# Patient Record
Sex: Male | Born: 1985 | Race: White | Hispanic: No | Marital: Single | Smoking: Never smoker
Health system: Southern US, Community
[De-identification: ages and names within clinical notes are randomized; demographics above are authoritative.]

---

## 2009-03-14 ENCOUNTER — Emergency Department (HOSPITAL_COMMUNITY): Admission: EM | Admit: 2009-03-14 | Discharge: 2009-03-15 | Payer: Self-pay | Admitting: Emergency Medicine

## 2010-10-21 IMAGING — CR DG ANKLE COMPLETE 3+V*R*
3 series · 3 of 3 positions shown · non-contrast
Comparison: None

CLINICAL DATA: Fall.  Ankle injury.  Pain and swelling.

RIGHT ANKLE - COMPLETE 3+ VIEW

[t ankle joint ap right]
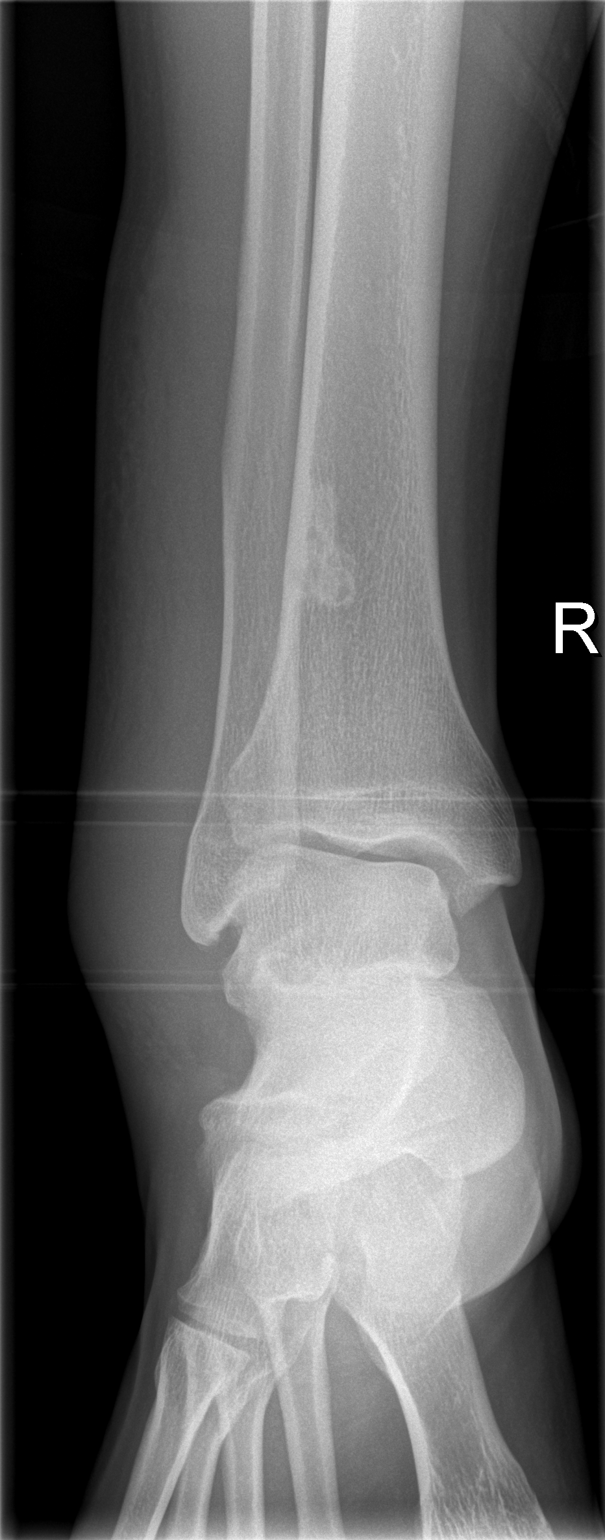

[t ankle joint oblique right]
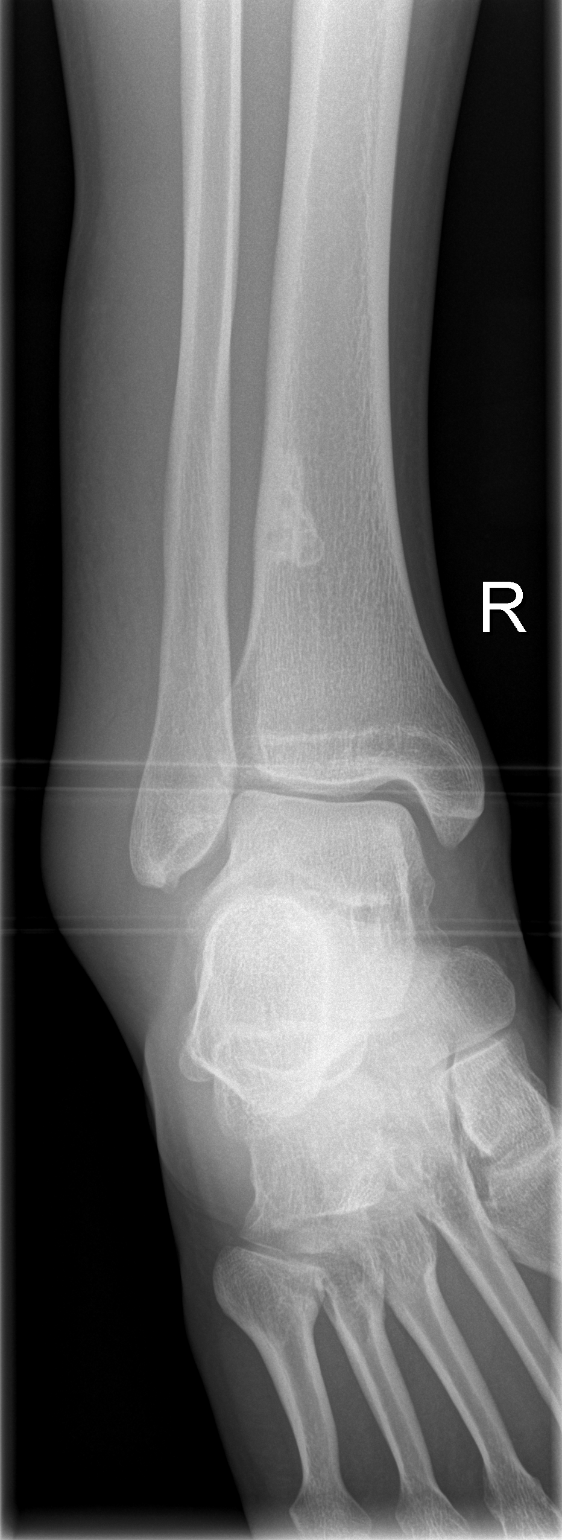

[t ankle joint lat right]
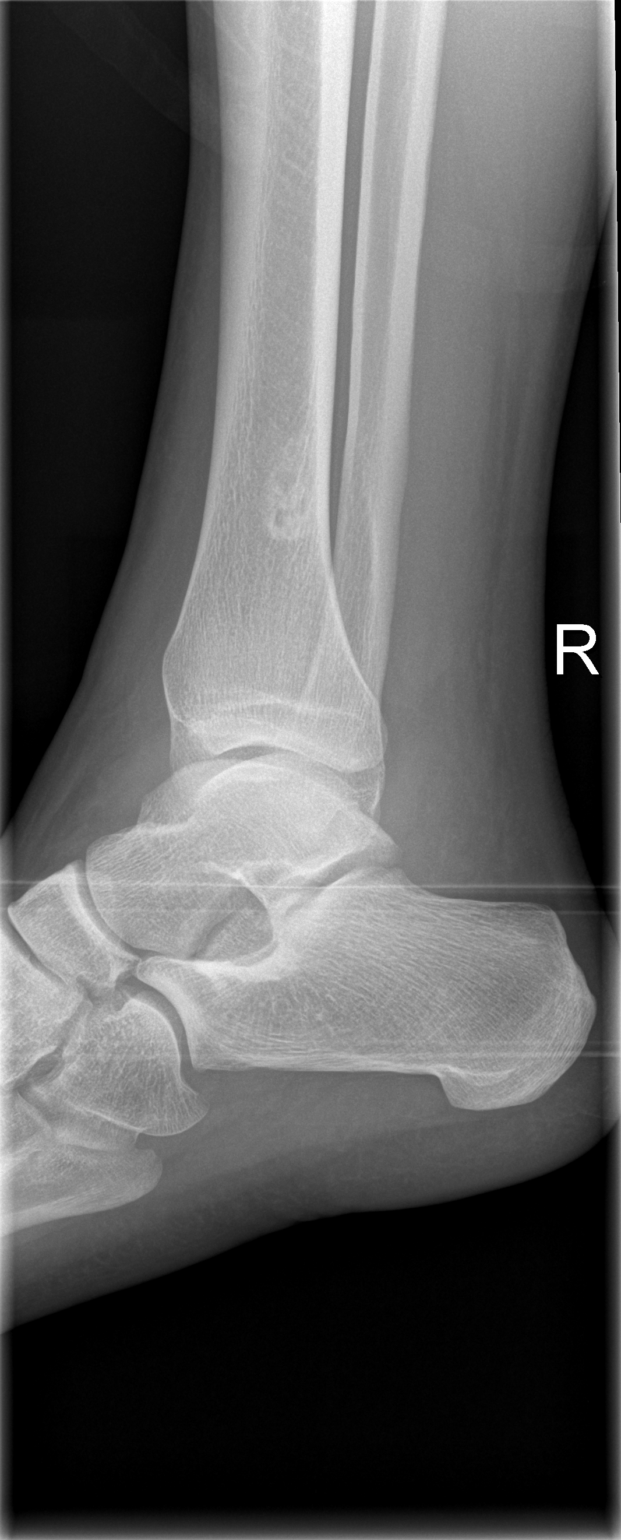

[3 of 3 positions shown; findings below may reference images not displayed]

FINDINGS: Severe soft tissue swelling is seen along the lateral
aspect of the ankle and distal fibula.  There is no evidence of
fracture or dislocation.  Incidental note is made of a non-
ossifying fibroma in the distal tibial metadiaphysis.
IMPRESSION: Lateral soft tissue swelling.  No evidence of fracture or
dislocation.

## 2016-08-15 ENCOUNTER — Encounter (HOSPITAL_COMMUNITY): Payer: Self-pay | Admitting: *Deleted

## 2016-08-15 ENCOUNTER — Emergency Department (HOSPITAL_COMMUNITY): Payer: No Typology Code available for payment source

## 2016-08-15 ENCOUNTER — Emergency Department (HOSPITAL_COMMUNITY)
Admission: EM | Admit: 2016-08-15 | Discharge: 2016-08-15 | Disposition: A | Discharge status: Home / Self Care | Payer: No Typology Code available for payment source | Attending: Emergency Medicine | Admitting: Emergency Medicine

## 2016-08-15 DIAGNOSIS — W208XXA Other cause of strike by thrown, projected or falling object, initial encounter: Secondary | ICD-10-CM | POA: Insufficient documentation

## 2016-08-15 DIAGNOSIS — Z79899 Other long term (current) drug therapy: Secondary | ICD-10-CM | POA: Insufficient documentation

## 2016-08-15 DIAGNOSIS — S99921A Unspecified injury of right foot, initial encounter: Secondary | ICD-10-CM | POA: Diagnosis present

## 2016-08-15 DIAGNOSIS — M79671 Pain in right foot: Secondary | ICD-10-CM

## 2016-08-15 DIAGNOSIS — Y93H2 Activity, gardening and landscaping: Secondary | ICD-10-CM | POA: Insufficient documentation

## 2016-08-15 DIAGNOSIS — T148XXA Other injury of unspecified body region, initial encounter: Secondary | ICD-10-CM

## 2016-08-15 DIAGNOSIS — Y999 Unspecified external cause status: Secondary | ICD-10-CM | POA: Diagnosis not present

## 2016-08-15 DIAGNOSIS — Y929 Unspecified place or not applicable: Secondary | ICD-10-CM | POA: Insufficient documentation

## 2016-08-15 DIAGNOSIS — S9031XA Contusion of right foot, initial encounter: Secondary | ICD-10-CM | POA: Insufficient documentation

## 2016-08-15 MED ORDER — OXYCODONE-ACETAMINOPHEN 5-325 MG PO TABS
1.0000 | ORAL_TABLET | ORAL | Status: DC | PRN
  Administered 2016-08-15: 1 via ORAL

## 2016-08-15 MED ORDER — HYDROCODONE-ACETAMINOPHEN 5-325 MG PO TABS: 1.0000 | ORAL_TABLET | ORAL | 0 refills | Status: AC | PRN

## 2016-08-15 MED ORDER — OXYCODONE-ACETAMINOPHEN 5-325 MG PO TABS
ORAL_TABLET | ORAL | Status: AC
  Filled 2016-08-15: qty 1

## 2016-08-15 NOTE — ED Provider Notes (Signed)
[facility]-EMERGENCY DEPT Provider Note   CSN: [id number] Arrival date & time: 08/15/16  1112  By signing my name below, I, Rosana Fret, attest that this documentation has been prepared under the direction and in the presence of non-physician practitioner, Maxwell Caul., PA-C. Electronically Signed: Rosana Fret, ED Scribe. 08/15/16. 12:53 PM.  History   Chief Complaint Chief Complaint  Patient presents with  . Foot Pain   The history is provided by the patient. No language interpreter was used.   HPI Comments: Ryan Lewis is a 31 y.o. male who presents to the Emergency Department complaining of sudden onset, moderate right foot pain onset 3 hours ago. Pt states he was cutting branches and one landed on his foot. Pt was walking fine inially due to the shock but states it is difficult now. Pt describes pain as throbbing. Pt reports associated swelling to the area and an abrasion. Pt has tried icing the area. Pt denies numbness or any other complaints at this time.  History reviewed. No pertinent past medical history.  There are no active problems to display for this patient.   History reviewed. No pertinent surgical history.   Home Medications    Prior to Admission medications   Medication Sig Start Date End Date Taking? Authorizing Provider  HYDROcodone-acetaminophen (NORCO/VICODIN) 5-325 MG tablet Take 1-2 tablets by mouth every 4 (four) hours as needed. 08/15/16   Maxwell Caul, PA-C    Family History History reviewed. No pertinent family history.  Social History Social History  Substance Use Topics  . Smoking status: Never Smoker  . Smokeless tobacco: Not on file  . Alcohol use Yes     Comment: occ     Allergies   Patient has no known allergies.   Review of Systems Review of Systems  Musculoskeletal: Positive for joint swelling and myalgias.  Skin: Positive for wound.  Neurological: Negative for numbness.     Physical Exam Updated Vital  Signs BP (!) 148/86 (BP Location: Left Arm)   Pulse (!) 104   Temp 98.6 F (37 C) (Oral)   Resp 18   SpO2 99%   Physical Exam  Constitutional: He appears well-developed and well-nourished.  Sitting comfortably on examination table  HENT:  Head: Normocephalic and atraumatic.  Eyes: Conjunctivae and EOM are normal. Right eye exhibits no discharge. Left eye exhibits no discharge. No scleral icterus.  Cardiovascular: Intact distal pulses.   2+ DP pulses bilaterally.  Pulmonary/Chest: Effort normal.  Musculoskeletal: Normal range of motion. He exhibits edema and tenderness. He exhibits no deformity.  Tenderness to palpation to the dorsal aspect of the right foot with overlying soft tissue swelling and ecchymosis. Full ROM of right ankle and right toes. No abnormalities of the left foot.   Neurological: He is alert. GCS eye subscore is 4. GCS verbal subscore is 5. GCS motor subscore is 6.  Sensation intact throughout all major nerve distributions of the foot  Skin: Skin is warm and dry.  Small superficial abrasions over the dorsal aspect of the foot  Psychiatric: He has a normal mood and affect. His speech is normal and behavior is normal.  Nursing note and vitals reviewed.    ED Treatments / Results  DIAGNOSTIC STUDIES: Oxygen Saturation is 98% on RA, normal by my interpretation.   COORDINATION OF CARE: 12:28 PM-Discussed next steps with pt including cleaning the area and follow up with ortho. Pt verbalized understanding and is agreeable with the plan.   Labs (all labs ordered are  listed, but only abnormal results are displayed) Labs Reviewed - No data to display  EKG  EKG Interpretation None       Radiology Dg Foot Complete Right  Result Date: 08/15/2016 CLINICAL DATA:  Tree limb fell on right foot today. Dorsal right foot pain and swelling. Initial encounter. EXAM: RIGHT FOOT COMPLETE - 3+ VIEW COMPARISON:  None. FINDINGS: There is no evidence of fracture or  dislocation. There is no evidence of arthropathy or other focal bone abnormality. Soft tissue swelling is seen along the dorsal aspect of the mid metatarsals. IMPRESSION: Dorsal midfoot soft tissue swelling.  No evidence of fracture. Electronically Signed   By: Myles RosenthalJohn  Stahl M.D.   On: 08/15/2016 11:50    Procedures Procedures (including critical care time)  Medications Ordered in ED Medications  oxyCODONE-acetaminophen (PERCOCET/ROXICET) 5-325 MG per tablet 1 tablet (1 tablet Oral Given 08/15/16 1124)     Initial Impression / Assessment and Plan / ED Course  I have reviewed the triage vital signs and the nursing notes.  Pertinent labs & imaging results that were available during my care of the patient were reviewed by me and considered in my medical decision making (see chart for details).      31 year old male who presents with right thumb pain after a branch fell on it this morning. Patient is neurovascularly intact. Consider fracture versus dislocation versus sprain. X-rays ordered at triage. Patient provided analgesics at triage. Patient X-Ray negative for obvious fracture or dislocation.   Discussed results with patient.Pt advised to follow up with orthopedics. Patient given a post-op shoe while in ED, conservative therapy recommended and discussed. Patient will be discharged home & is agreeable with above plan. Returns precautions discussed. Pt appears safe for discharge.   Final Clinical Impressions(s) / ED Diagnoses   Final diagnoses:  Foot pain, right  Hematoma  Contusion of right foot, initial encounter    New Prescriptions New Prescriptions   HYDROCODONE-ACETAMINOPHEN (NORCO/VICODIN) 5-325 MG TABLET    Take 1-2 tablets by mouth every 4 (four) hours as needed.   I personally performed the services described in this documentation, which was scribed in my presence. The recorded information has been reviewed and is accurate.    Maxwell CaulLayden, Lindsey A, PA-C 08/15/16 1347     Maia PlanLong, Joshua G, MD 08/15/16 [phone]

## 2016-08-15 NOTE — ED Notes (Signed)
See assessment by PA

## 2016-08-15 NOTE — ED Triage Notes (Signed)
Pt reports doing yard work in sandals, cut a branch and it landed on right foot. Moderate swelling noted.

## 2016-08-15 NOTE — Progress Notes (Signed)
Orthopedic Tech Progress Note Patient Details:  Ryan Lewis 08/19/1985 [id number]  Ortho Devices Type of Ortho Device: Postop shoe/boot Ortho Device/Splint Interventions: Application   Saul FordyceJennifer C Haiden Clucas 08/15/2016, 1:00 PM

## 2016-08-15 NOTE — Discharge Instructions (Signed)
You can take Tylenol or Ibuprofen as directed for pain.  You can take the prescribed pain medications for breakthrough pain.   Follow the rice protocol  Follow-up with referred orthopedic doctor one to 2 weeks there are no improvement in symptoms.  Return the emergency department for any worsening pain, swelling, redness of the foot, numbness/weakness, difficulty walking or any other worsening or concerning symptoms.  For activity: Use crutches with nonweightbearing for the first few days. Then, you may walk on your ankles as the pain allows, or as instructed. Start gradually with weight bearing on the affected ankle. Once you can walk pain free, then try jogging. When you can run forwards, then you can try moving side to side. If you cannot walk without crutches in one week, you need a recheck by your Family Doctor.

## 2018-03-24 IMAGING — CR DG FOOT COMPLETE 3+V*R*
3 series · 3 of 3 positions shown · non-contrast
Comparison: None.

CLINICAL DATA: Tree limb fell on right foot today. Dorsal right
foot pain and swelling. Initial encounter.

EXAM:
RIGHT FOOT COMPLETE - 3+ VIEW

[foot ap]
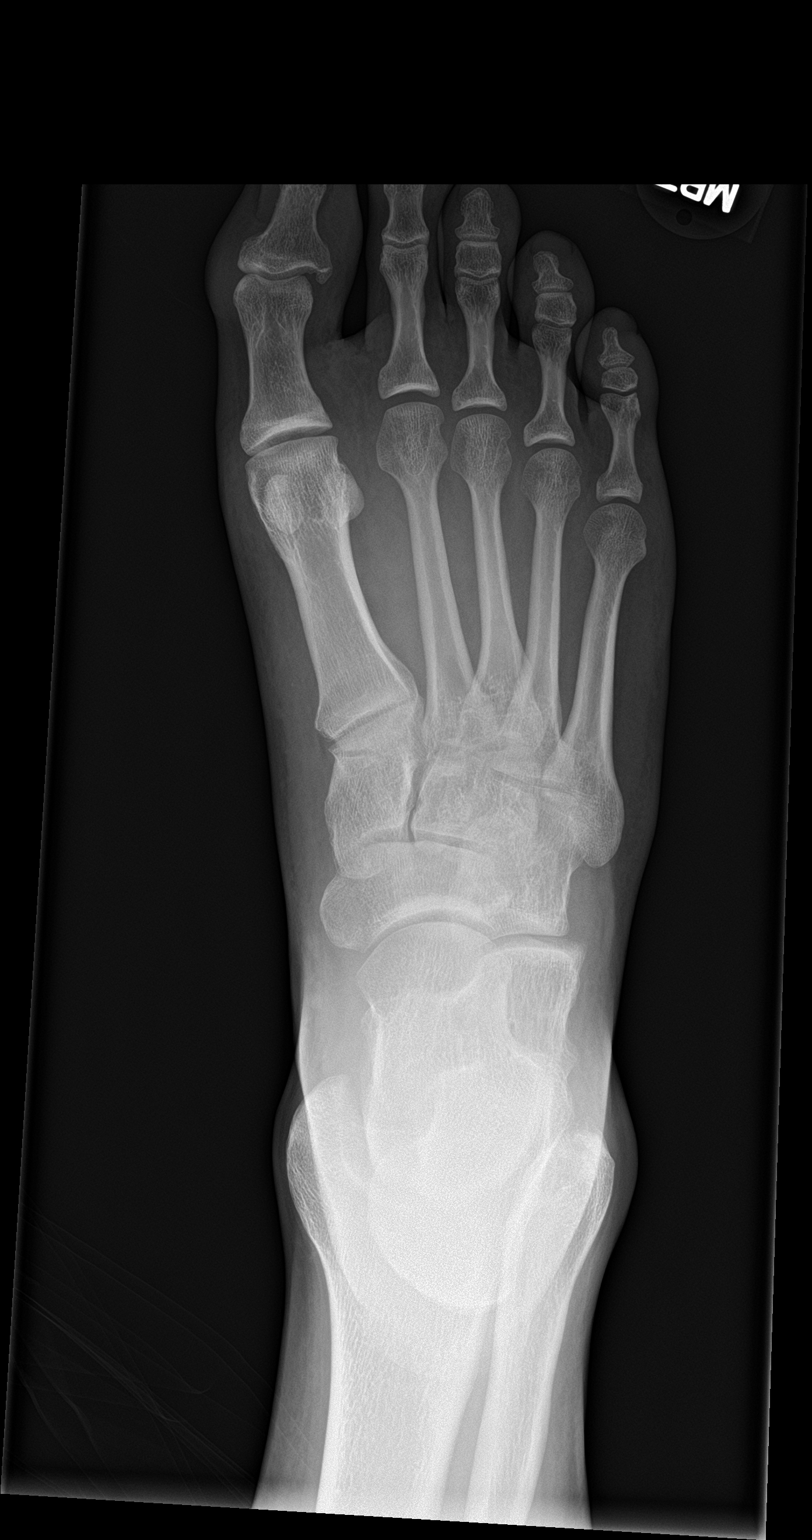

[foot obl]
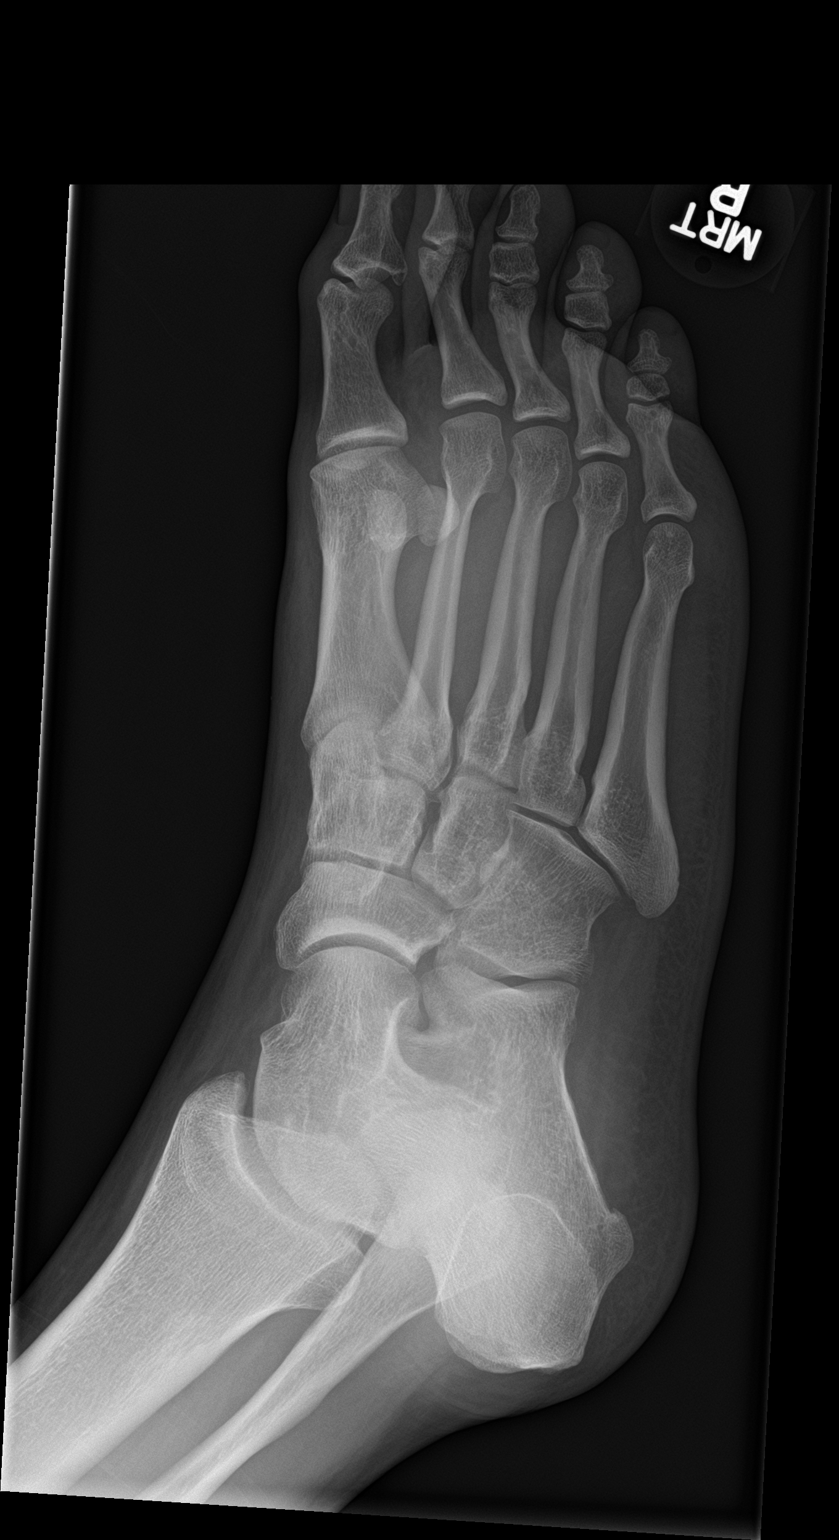

[foot lat]
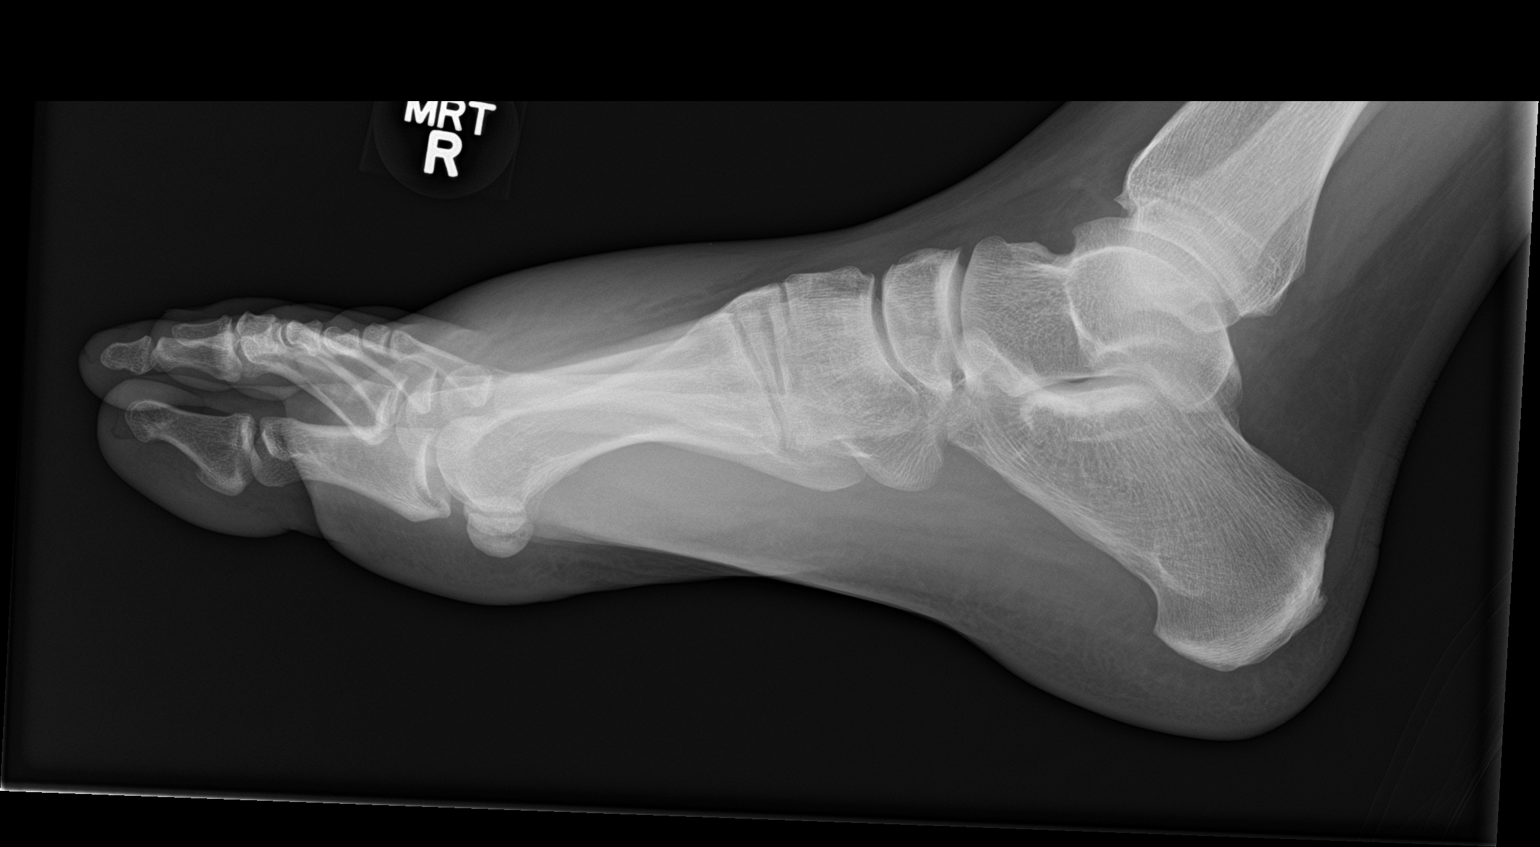

[3 of 3 positions shown; findings below may reference images not displayed]

FINDINGS: There is no evidence of fracture or dislocation. There is no
evidence of arthropathy or other focal bone abnormality. Soft tissue
swelling is seen along the dorsal aspect of the mid metatarsals.
IMPRESSION: Dorsal midfoot soft tissue swelling.  No evidence of fracture.

## 2019-05-13 ENCOUNTER — Ambulatory Visit: Payer: No Typology Code available for payment source | Attending: Internal Medicine

## 2019-05-13 DIAGNOSIS — Z23 Encounter for immunization: Secondary | ICD-10-CM

## 2019-05-13 NOTE — Progress Notes (Signed)
   Covid-19 Vaccination Clinic  Name:  Ryan Lewis    MRN: [id number] DOB: September 10, 1985  05/13/2019  [name] was observed post Covid-19 immunization for 15 minutes without incident. He was provided with Vaccine Information Sheet and instruction to access the V-Safe system.   [name] was instructed to call 911 with any severe reactions post vaccine: Marland Kitchen Difficulty breathing  . Swelling of face and throat  . A fast heartbeat  . A bad rash all over body  . Dizziness and weakness   Immunizations Administered    Name Date Dose VIS Date Route   Pfizer COVID-19 Vaccine 05/13/2019  3:18 PM 0.3 mL 01/27/2019 Intramuscular   Manufacturer: [employer], [employer]   Lot: [id number]   NDC: [address]-0

## 2019-06-06 ENCOUNTER — Ambulatory Visit: Payer: No Typology Code available for payment source | Attending: Internal Medicine

## 2019-06-06 DIAGNOSIS — Z23 Encounter for immunization: Secondary | ICD-10-CM

## 2019-06-06 NOTE — Progress Notes (Signed)
   Covid-19 Vaccination Clinic  Name:  Konner Saiz    MRN: [id number] DOB: September 17, 1985  06/06/2019  Mr. Canning was observed post Covid-19 immunization for 15 minutes without incident. He was provided with Vaccine Information Sheet and instruction to access the V-Safe system.   Mr. Mofield was instructed to call 911 with any severe reactions post vaccine: Marland Kitchen Difficulty breathing  . Swelling of face and throat  . A fast heartbeat  . A bad rash all over body  . Dizziness and weakness   Immunizations Administered    Name Date Dose VIS Date Route   Pfizer COVID-19 Vaccine 06/06/2019  3:58 PM 0.3 mL 04/12/2018 Intramuscular   Manufacturer: [employer], [employer]   Lot: [id number]   NDC: [address]-5
